# Patient Record
Sex: Female | Born: 1990 | Hispanic: Yes | Marital: Single | Smoking: Never smoker
Health system: Southern US, Community
[De-identification: ages and names within clinical notes are randomized; demographics above are authoritative.]

---

## 2009-12-28 ENCOUNTER — Ambulatory Visit: Payer: Self-pay | Admitting: Family Medicine

## 2010-06-13 ENCOUNTER — Observation Stay: Payer: Self-pay | Admitting: Obstetrics and Gynecology

## 2010-06-14 ENCOUNTER — Inpatient Hospital Stay: Payer: Self-pay | Admitting: Obstetrics and Gynecology

## 2013-06-11 ENCOUNTER — Inpatient Hospital Stay: Payer: Self-pay

## 2013-06-11 LAB — CBC WITH DIFFERENTIAL/PLATELET
Basophil #: 0.1 10*3/uL (ref 0.0–0.1)
Eosinophil %: 0.3 %
HGB: 12.8 g/dL (ref 12.0–16.0)
Lymphocyte #: 1.9 10*3/uL (ref 1.0–3.6)
Lymphocyte %: 14.6 %
Monocyte #: 1 x10 3/mm — ABNORMAL HIGH (ref 0.2–0.9)
Monocyte %: 7.7 %
Neutrophil #: 9.7 10*3/uL — ABNORMAL HIGH (ref 1.4–6.5)
Neutrophil %: 76.6 %
RBC: 4.17 10*6/uL (ref 3.80–5.20)

## 2013-06-13 LAB — HEMATOCRIT: HCT: 32.3 % — ABNORMAL LOW (ref 35.0–47.0)

## 2015-03-15 NOTE — H&P (Signed)
L&D Evaluation:  History:  HPI 24yo G2P1001 with LMP of 09/15/12 & [facility] significant for migraines, Tdap and abnormal pap. SROM at 2200 clear fluid. UC's are becoming more active. Pt wants epidural when able.   Presents with leaking fluid   Patient's Medical History No Chronic Illness   Patient's Surgical History none   Medications Pre Natal Vitamins   Allergies NKDA   Social History EtOH   Family History Non-Contributory   ROS:  ROS All systems were reviewed.  HEENT, CNS, GI, GU, Respiratory, CV, Renal and Musculoskeletal systems were found to be normal.   Exam:  Vital Signs stable  BP 114/67   General no apparent distress   Mental Status clear   Chest clear   Heart normal sinus rhythm, no murmur/gallop/rubs   Abdomen gravid, non-tender   Estimated Fetal Weight Average for gestational age   Fetal Position vtx   Back no CVAT   Mebranes Ruptured, clear   FHT normal rate with no decels   Ucx regular   Skin dry   Lymph no lymphadenopathy   Impression:  Impression early labor, iUP AT TERM   Plan:  Plan monitor contractions and for cervical change   Electronic Signatures: Sharee PimpleJones, Caron W (CNM)  (Signed 07-Aug-14 23:41)  Authored: L&D Evaluation   Last Updated: 07-Aug-14 23:41 by Sharee PimpleJones, Caron W (CNM)

## 2015-11-06 NOTE — L&D Delivery Note (Addendum)
Delivery Note At 2:02 AM a viable female was delivered via Vaginal, Spontaneous Delivery  At 0202 on 08/04/16(Presentation: ;  ).  APGAR:8/8 , ; weight  .  Spontaneous cry  Placenta status:intact , stained  , .  Corddelayed cord clamping :  with the following complications: .None   Anesthesia:  none Episiotomy:  none Lacerations: none  Est. Blood Loss (mL)300cc   Mom to postpartum.  Baby to Couplet care / Skin to Skin.  SCHERMERHORN,THOMAS 08/04/2016, 2:09 AM

## 2016-01-18 LAB — OB RESULTS CONSOLE GC/CHLAMYDIA
CHLAMYDIA, DNA PROBE: NEGATIVE
GC PROBE AMP, GENITAL: NEGATIVE

## 2016-01-18 LAB — OB RESULTS CONSOLE RPR: RPR: NONREACTIVE

## 2016-01-18 LAB — OB RESULTS CONSOLE HEPATITIS B SURFACE ANTIGEN: Hepatitis B Surface Ag: NEGATIVE

## 2016-01-18 LAB — OB RESULTS CONSOLE RUBELLA ANTIBODY, IGM: Rubella: IMMUNE

## 2016-01-18 LAB — OB RESULTS CONSOLE ABO/RH: RH Type: POSITIVE

## 2016-01-18 LAB — OB RESULTS CONSOLE HIV ANTIBODY (ROUTINE TESTING): HIV: NONREACTIVE

## 2016-01-18 LAB — OB RESULTS CONSOLE VARICELLA ZOSTER ANTIBODY, IGG: VARICELLA IGG: IMMUNE

## 2016-02-14 ENCOUNTER — Other Ambulatory Visit: Payer: Self-pay | Admitting: Primary Care

## 2016-02-14 DIAGNOSIS — Z3482 Encounter for supervision of other normal pregnancy, second trimester: Secondary | ICD-10-CM

## 2016-02-28 ENCOUNTER — Ambulatory Visit
Admission: RE | Admit: 2016-02-28 | Discharge: 2016-02-28 | Disposition: A | Discharge status: Home / Self Care | Payer: Medicaid Other | Source: Ambulatory Visit | Attending: Primary Care | Admitting: Primary Care

## 2016-02-28 DIAGNOSIS — Z3482 Encounter for supervision of other normal pregnancy, second trimester: Secondary | ICD-10-CM | POA: Diagnosis not present

## 2016-02-28 DIAGNOSIS — Z3A17 17 weeks gestation of pregnancy: Secondary | ICD-10-CM | POA: Insufficient documentation

## 2016-03-15 ENCOUNTER — Other Ambulatory Visit: Payer: Self-pay | Admitting: Primary Care

## 2016-03-15 DIAGNOSIS — Z3482 Encounter for supervision of other normal pregnancy, second trimester: Secondary | ICD-10-CM

## 2016-03-23 ENCOUNTER — Ambulatory Visit: Payer: Self-pay

## 2016-04-05 ENCOUNTER — Ambulatory Visit
Admission: RE | Admit: 2016-04-05 | Discharge: 2016-04-05 | Disposition: A | Discharge status: Home / Self Care | Payer: Medicaid Other | Source: Ambulatory Visit | Attending: Primary Care | Admitting: Primary Care

## 2016-04-05 DIAGNOSIS — Z3A22 22 weeks gestation of pregnancy: Secondary | ICD-10-CM | POA: Diagnosis not present

## 2016-04-05 DIAGNOSIS — Z3482 Encounter for supervision of other normal pregnancy, second trimester: Secondary | ICD-10-CM | POA: Insufficient documentation

## 2016-07-07 LAB — OB RESULTS CONSOLE GBS: GBS: NEGATIVE

## 2016-08-04 ENCOUNTER — Encounter: Payer: Self-pay | Admitting: *Deleted

## 2016-08-04 ENCOUNTER — Inpatient Hospital Stay
Admission: EM | Admit: 2016-08-04 | Discharge: 2016-08-05 | DRG: 775 | Disposition: A | Discharge status: Home / Self Care | Payer: Medicaid Other | Attending: Obstetrics and Gynecology | Admitting: Obstetrics and Gynecology

## 2016-08-04 DIAGNOSIS — Z3A39 39 weeks gestation of pregnancy: Secondary | ICD-10-CM | POA: Diagnosis not present

## 2016-08-04 DIAGNOSIS — Z3483 Encounter for supervision of other normal pregnancy, third trimester: Secondary | ICD-10-CM | POA: Diagnosis present

## 2016-08-04 DIAGNOSIS — IMO0001 Reserved for inherently not codable concepts without codable children: Secondary | ICD-10-CM

## 2016-08-04 LAB — TYPE AND SCREEN
ABO/RH(D): A POS
ANTIBODY SCREEN: NEGATIVE

## 2016-08-04 LAB — CBC
HEMATOCRIT: 37.1 % (ref 35.0–47.0)
HEMATOCRIT: 34.4 % — AB (ref 35.0–47.0)
HEMOGLOBIN: 12.9 g/dL (ref 12.0–16.0)
Hemoglobin: 12 g/dL (ref 12.0–16.0)
MCH: 31.1 pg (ref 26.0–34.0)
MCH: 31.6 pg (ref 26.0–34.0)
MCHC: 34.9 g/dL (ref 32.0–36.0)
MCHC: 35 g/dL (ref 32.0–36.0)
MCV: 89.2 fL (ref 80.0–100.0)
MCV: 90.2 fL (ref 80.0–100.0)
Platelets: 196 10*3/uL (ref 150–440)
Platelets: 193 10*3/uL (ref 150–440)
RBC: 4.15 MIL/uL (ref 3.80–5.20)
RBC: 3.81 MIL/uL (ref 3.80–5.20)
RDW: 12.9 % (ref 11.5–14.5)
RDW: 13.1 % (ref 11.5–14.5)
WBC: 15.6 10*3/uL — AB (ref 3.6–11.0)
WBC: 20 10*3/uL — AB (ref 3.6–11.0)

## 2016-08-04 MED ORDER — DIPHENHYDRAMINE HCL 25 MG PO CAPS: 25.0000 mg | ORAL_CAPSULE | Freq: Four times a day (QID) | ORAL | Status: DC | PRN

## 2016-08-04 MED ORDER — LIDOCAINE HCL (PF) 1 % IJ SOLN: 30.0000 mL | INTRAMUSCULAR | Status: DC | PRN

## 2016-08-04 MED ORDER — ONDANSETRON HCL 4 MG/2ML IJ SOLN
4.0000 mg | Freq: Four times a day (QID) | INTRAMUSCULAR | Status: DC | PRN
Start: 2016-08-04 — End: 2016-08-04

## 2016-08-04 MED ORDER — MISOPROSTOL 200 MCG PO TABS
ORAL_TABLET | ORAL | Status: AC
  Filled 2016-08-04: qty 4

## 2016-08-04 MED ORDER — ACETAMINOPHEN 325 MG PO TABS: 650.0000 mg | ORAL_TABLET | ORAL | Status: DC | PRN

## 2016-08-04 MED ORDER — BENZOCAINE-MENTHOL 20-0.5 % EX AERO
1.0000 "application " | INHALATION_SPRAY | CUTANEOUS | Status: DC | PRN
  Administered 2016-08-04: 1 via TOPICAL
  Filled 2016-08-04: qty 56

## 2016-08-04 MED ORDER — OXYCODONE-ACETAMINOPHEN 5-325 MG PO TABS: 1.0000 | ORAL_TABLET | ORAL | Status: DC | PRN

## 2016-08-04 MED ORDER — LACTATED RINGERS IV SOLN: 500.0000 mL | INTRAVENOUS | Status: DC | PRN

## 2016-08-04 MED ORDER — MEASLES, MUMPS & RUBELLA VAC ~~LOC~~ INJ
0.5000 mL | INJECTION | Freq: Once | SUBCUTANEOUS | Status: AC
  Administered 2016-08-05: 0.5 mL via SUBCUTANEOUS
  Filled 2016-08-04: qty 0.5

## 2016-08-04 MED ORDER — OXYCODONE-ACETAMINOPHEN 5-325 MG PO TABS: 2.0000 | ORAL_TABLET | ORAL | Status: DC | PRN

## 2016-08-04 MED ORDER — ONDANSETRON HCL 4 MG PO TABS: 4.0000 mg | ORAL_TABLET | ORAL | Status: DC | PRN

## 2016-08-04 MED ORDER — MAGNESIUM HYDROXIDE 400 MG/5ML PO SUSP: 30.0000 mL | ORAL | Status: DC | PRN

## 2016-08-04 MED ORDER — PRENATAL MULTIVITAMIN CH
1.0000 | ORAL_TABLET | Freq: Every day | ORAL | Status: DC
  Administered 2016-08-04 – 2016-08-05 (×2): 1 via ORAL
  Filled 2016-08-04 (×2): qty 1

## 2016-08-04 MED ORDER — SOD CITRATE-CITRIC ACID 500-334 MG/5ML PO SOLN
30.0000 mL | ORAL | Status: DC | PRN
  Filled 2016-08-04: qty 30

## 2016-08-04 MED ORDER — SENNOSIDES-DOCUSATE SODIUM 8.6-50 MG PO TABS
2.0000 | ORAL_TABLET | ORAL | Status: DC
  Administered 2016-08-05: 2 via ORAL
  Filled 2016-08-04: qty 2

## 2016-08-04 MED ORDER — OXYTOCIN 40 UNITS IN LACTATED RINGERS INFUSION - SIMPLE MED
INTRAVENOUS | Status: AC
  Administered 2016-08-04: 500 mL via INTRAVENOUS
  Filled 2016-08-04: qty 1000

## 2016-08-04 MED ORDER — DIBUCAINE 1 % RE OINT: 1.0000 "application " | TOPICAL_OINTMENT | RECTAL | Status: DC | PRN

## 2016-08-04 MED ORDER — COCONUT OIL OIL
1.0000 "application " | TOPICAL_OIL | Status: DC | PRN
  Administered 2016-08-04: 1 via TOPICAL
  Filled 2016-08-04: qty 120

## 2016-08-04 MED ORDER — ZOLPIDEM TARTRATE 5 MG PO TABS: 5.0000 mg | ORAL_TABLET | Freq: Every evening | ORAL | Status: DC | PRN

## 2016-08-04 MED ORDER — ONDANSETRON HCL 4 MG/2ML IJ SOLN: 4.0000 mg | INTRAMUSCULAR | Status: DC | PRN

## 2016-08-04 MED ORDER — OXYTOCIN 10 UNIT/ML IJ SOLN
INTRAMUSCULAR | Status: AC
  Filled 2016-08-04: qty 2

## 2016-08-04 MED ORDER — SIMETHICONE 80 MG PO CHEW: 80.0000 mg | CHEWABLE_TABLET | ORAL | Status: DC | PRN

## 2016-08-04 MED ORDER — BUTORPHANOL TARTRATE 1 MG/ML IJ SOLN: 1.0000 mg | INTRAMUSCULAR | Status: DC | PRN

## 2016-08-04 MED ORDER — LIDOCAINE HCL (PF) 1 % IJ SOLN
INTRAMUSCULAR | Status: AC
  Filled 2016-08-04: qty 30

## 2016-08-04 MED ORDER — OXYTOCIN 40 UNITS IN LACTATED RINGERS INFUSION - SIMPLE MED: 2.5000 [IU]/h | INTRAVENOUS | Status: DC

## 2016-08-04 MED ORDER — WITCH HAZEL-GLYCERIN EX PADS: 1.0000 "application " | MEDICATED_PAD | CUTANEOUS | Status: DC | PRN

## 2016-08-04 MED ORDER — AMMONIA AROMATIC IN INHA
RESPIRATORY_TRACT | Status: AC
  Filled 2016-08-04: qty 10

## 2016-08-04 MED ORDER — LACTATED RINGERS IV SOLN
INTRAVENOUS | Status: DC
  Administered 2016-08-04: 01:00:00 via INTRAVENOUS

## 2016-08-04 MED ORDER — OXYTOCIN BOLUS FROM INFUSION
500.0000 mL | Freq: Once | INTRAVENOUS | Status: AC
  Administered 2016-08-04: 500 mL via INTRAVENOUS

## 2016-08-04 MED ORDER — IBUPROFEN 600 MG PO TABS
600.0000 mg | ORAL_TABLET | Freq: Four times a day (QID) | ORAL | Status: DC
  Administered 2016-08-04 – 2016-08-05 (×6): 600 mg via ORAL
  Filled 2016-08-04 (×6): qty 1

## 2016-08-04 MED ORDER — ACETAMINOPHEN 325 MG PO TABS
650.0000 mg | ORAL_TABLET | ORAL | Status: DC | PRN
  Administered 2016-08-05: 650 mg via ORAL
  Filled 2016-08-04: qty 2

## 2016-08-04 MED ORDER — FERROUS SULFATE 325 (65 FE) MG PO TABS
325.0000 mg | ORAL_TABLET | Freq: Two times a day (BID) | ORAL | Status: DC
  Administered 2016-08-04 – 2016-08-05 (×3): 325 mg via ORAL
  Filled 2016-08-04 (×3): qty 1

## 2016-08-04 NOTE — H&P (Signed)
Hannah Carroll is a 25 y.o. female presenting for labor. OB History    Gravida Para Term Preterm AB Living   3 2 2    0 2   SAB TAB Ectopic Multiple Live Births                 History reviewed. No pertinent past medical history. History reviewed. No pertinent surgical history. Family History: family history is not on file. Social History:  reports that she has never smoked. She has never used smokeless tobacco. She reports that she does not drink alcohol or use drugs.     Maternal Diabetes: No Genetic Screening: Normal Maternal Ultrasounds/Referrals: Normal Fetal Ultrasounds or other Referrals:  None Maternal Substance Abuse:  No Significant Maternal Medications:  None Significant Maternal Lab Results:  None Other Comments:  None  ROS History Dilation: 8 Exam by:: SJD, MD Blood pressure 105/76, pulse (!) 116, temperature 98.4 F (36.9 C), temperature source Oral, resp. rate 20, height 5\' 1"  (1.549 m), weight 187 lb (84.8 kg). Exam Physical Exam  Lungs CTA  cv RRR cx exam as above . + meconium on arom   Prenatal labs: nst efm 130 + accels , no decels  ABO, Rh:   a+ Antibody:  neg Rubella:  imm RPR:   NR HBsAg:   neg HIV:   neg GBS:   neg  Assessment/Plan: Active labor  +meconium  Anticipate svd shortly    Hannah Carroll 08/04/2016, 12:55 AM

## 2016-08-05 LAB — RPR: RPR Ser Ql: NONREACTIVE

## 2016-08-05 MED ORDER — IBUPROFEN 600 MG PO TABS: 600.0000 mg | ORAL_TABLET | Freq: Four times a day (QID) | ORAL | 0 refills | Status: AC

## 2016-08-05 NOTE — Discharge Instructions (Signed)
Call your doctor for increased pain or vaginal bleeding, temperature above 100.4, depression, or concerns.  No strenuous activity or heavy lifting for 6 weeks.  No intercourse, tampons, douching, or enemas for 6 weeks.  No tub baths-showers only.  No driving for 2 weeks or while taking pain medications.  Continue prenatal vitamin and iron.  Increase calories and fluids while breastfeeding. °

## 2016-08-05 NOTE — Discharge Summary (Signed)
Obstetric Discharge Summary Reason for Admission: onset of labor Prenatal Procedures: none Intrapartum Procedures: spontaneous vaginal delivery Postpartum Procedures: none Complications-Operative and Postpartum: none Hemoglobin  Date Value Ref Range Status  08/04/2016 12.0 12.0 - 16.0 g/dL Final   HGB  Date Value Ref Range Status  06/11/2013 12.8 12.0 - 16.0 g/dL Final   HCT  Date Value Ref Range Status  08/04/2016 34.4 (L) 35.0 - 47.0 % Final  06/13/2013 32.3 (L) 35.0 - 47.0 % Final    Physical Exam:  General: alert and cooperative Lochia: appropriate Uterine Fundus: firm Incision: n/a DVT Evaluation: No evidence of DVT seen on physical exam.  Discharge Diagnoses: Term Pregnancy-delivered  Discharge Information: Date: 08/05/2016 Activity: pelvic rest Diet: routine Medications: Ibuprofen Condition: stable Instructions: refer to practice specific booklet Discharge to: home Follow-up Information    [facility]   Specialty:  General Practice Why:  pp care Contact information: [address] [place] [address] [phone]           Newborn Data: Live born female  Birth Weight: 7 lb 9.7 oz (3450 g) APGAR: 8, 8  Home with mother.  SCHERMERHORN,THOMAS 08/05/2016, 10:09 AM

## 2016-08-05 NOTE — Progress Notes (Signed)
Discharge instructions provided.  Pt and sig other verbalize understanding of all instructions and follow-up care.  Prescription given.  Pt discharged to home with infant at 1500 on 08/05/16 via wheelchair by CNA. Reynold BowenSusan Paisley Jere Bostrom, RN 08/05/2016 3:19 PM

## 2016-08-05 NOTE — Progress Notes (Signed)
Prenatal records indicate that pt received TDaP vaccine on 06/07/16 and Influenza vaccine on 07/26/16. Reynold BowenSusan Paisley Kimiko Common, RN 08/05/2016 12:44 PM

## 2017-09-17 IMAGING — US US OB COMP +14 WK
1 series · 13 of 28 positions shown · non-contrast
Comparison: none

CLINICAL DATA: Pregnancy.

EXAM:
OBSTETRICAL ULTRASOUND >14 WKS

[Series 1: us ob comp +14 wk · 0.25mm/px · 13 of 71 slices shown]
[im 3/71]
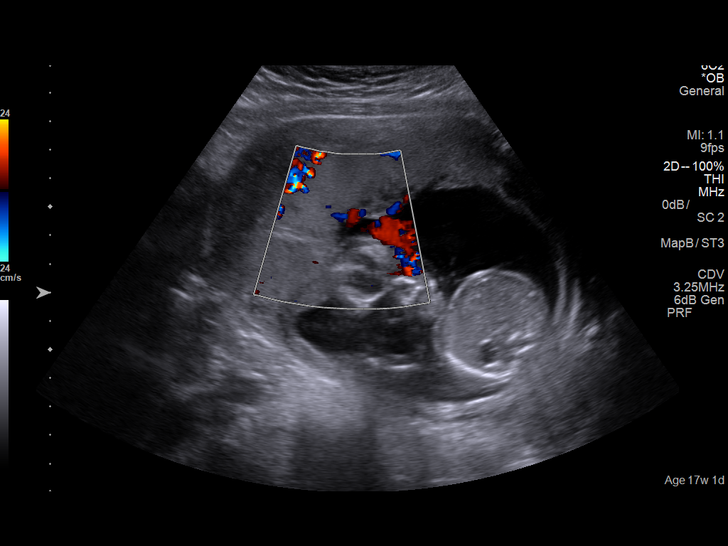
[im 8/71]
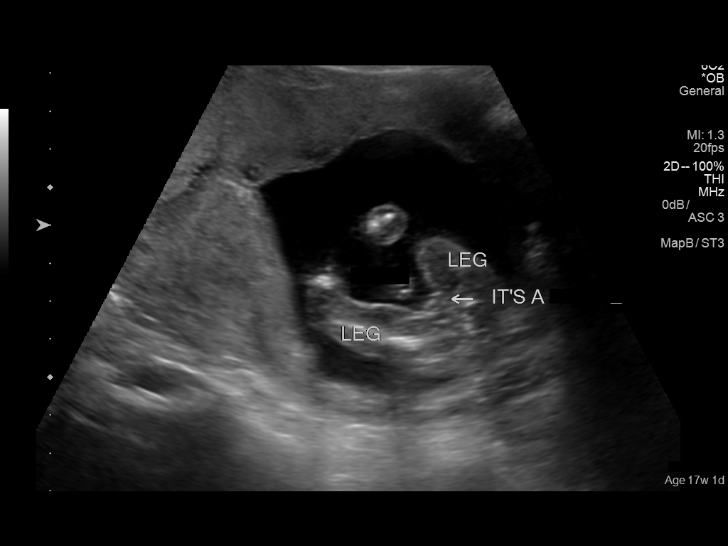
[im 13/71]
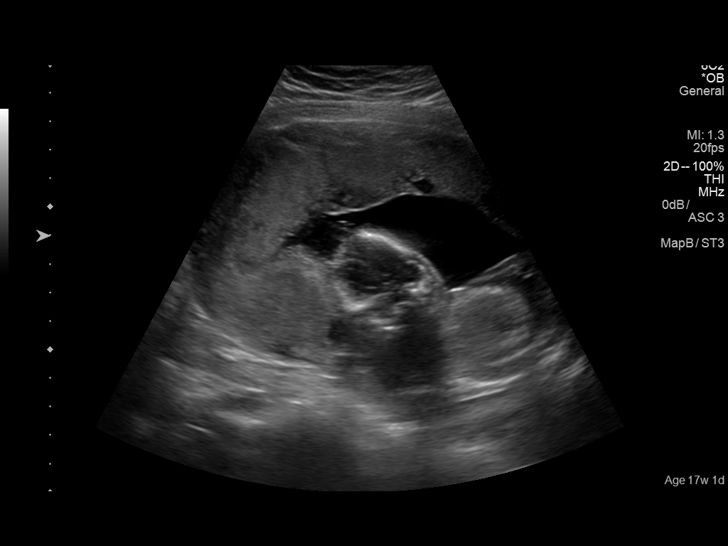
[im 19/71]
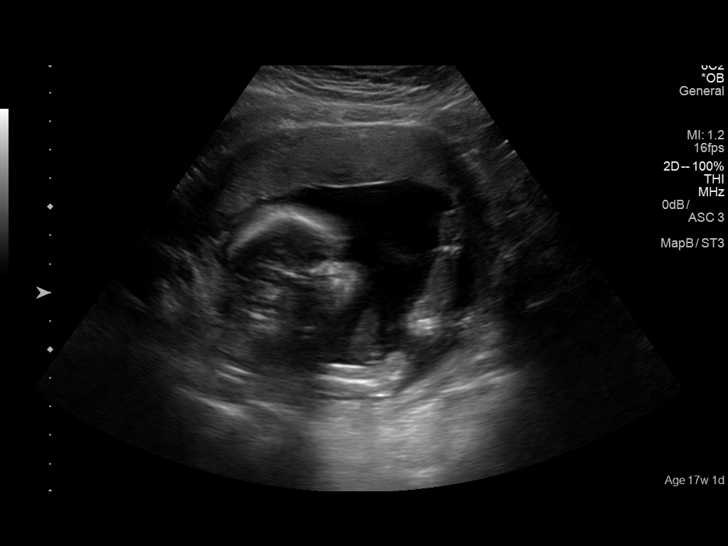
[im 24/71]
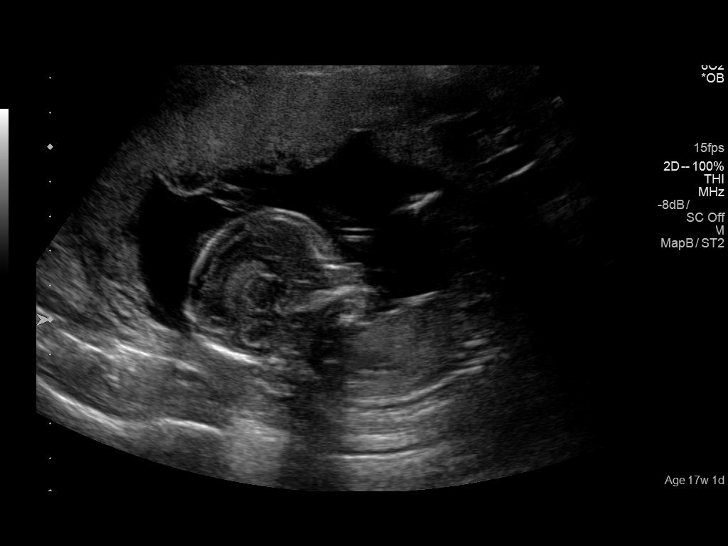
[im 29/71]
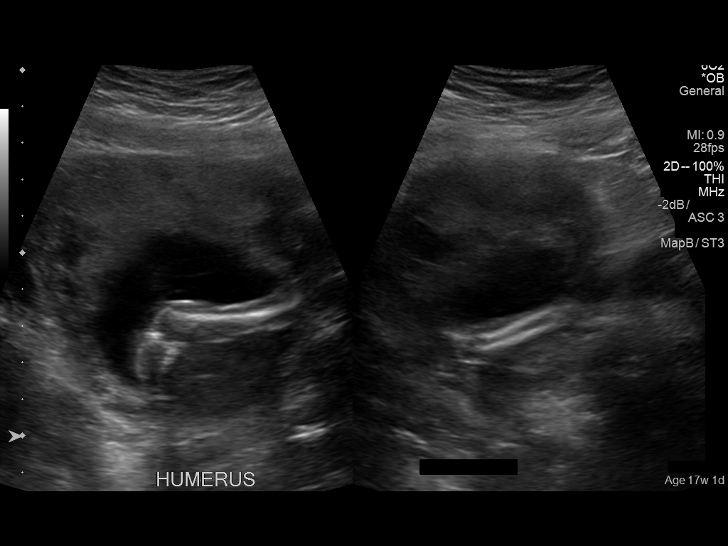
[im 37/71]
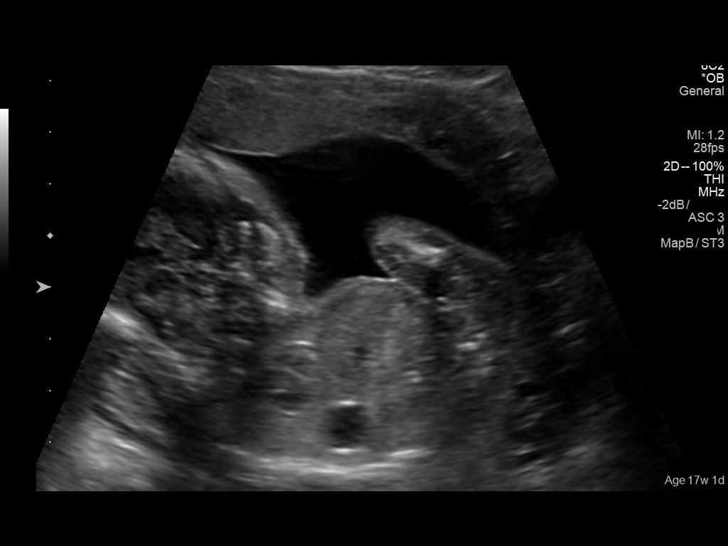
[im 42/71]
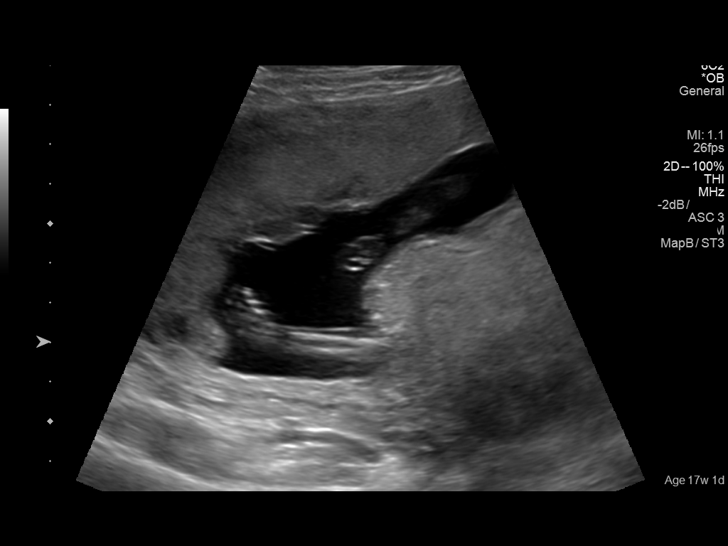
[im 47/71]
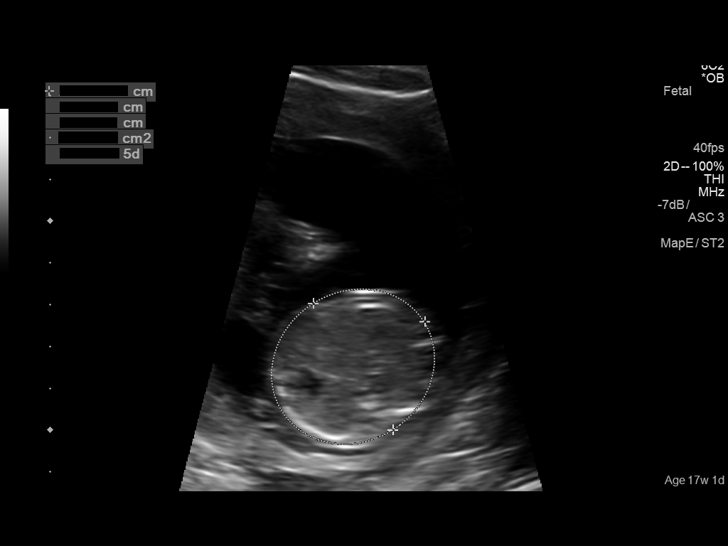
[im 52/71]
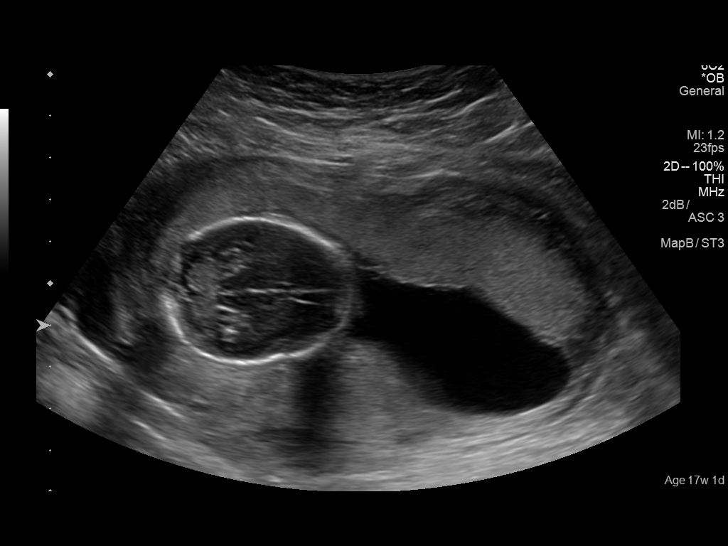
[im 58/71]
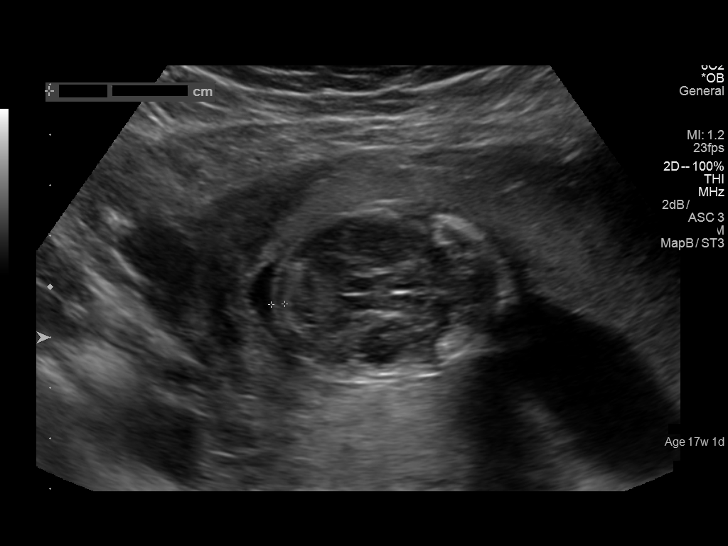
[im 63/71]
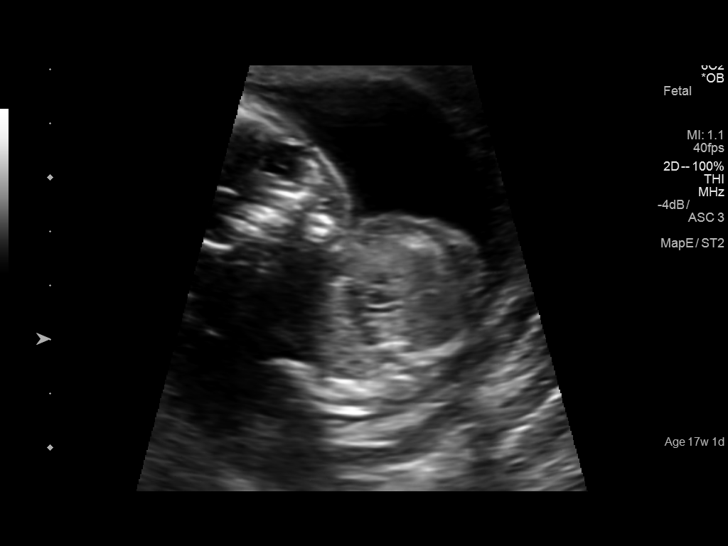
[im 68/71]
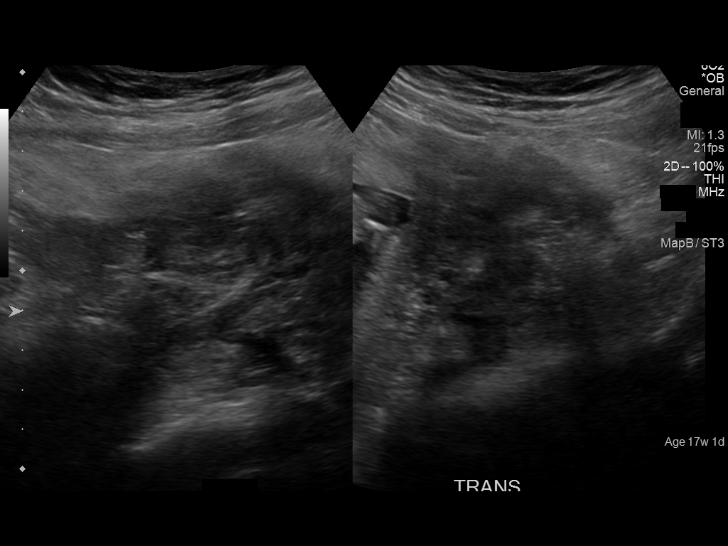

[13 of 28 positions shown; findings below may reference images not displayed]

FINDINGS: Number of Fetuses: 1

Heart Rate:  149 bpm

Movement: Present

Presentation: Transverse

Previa: None

Placental Location: Fundal

Amniotic Fluid (Subjective): Normal

Amniotic Fluid (Objective):

Vertical pocket 3.1cm

FETAL BIOMETRY

BPD:  13.4cm 16w 3d

HC:    13.5cm  17w   0d

AC:   12.0cm  17w   5d

FL:   2.4cm  17w   1d

Current Mean GA: 17w 1d              US EDC: 08/06/2016

FETAL ANATOMY

Lateral Ventricles: Visualized

Thalami/CSP: Visualized

Posterior Fossa:  Visualized

Nuchal Region: Visualized

Upper Lip: Visualized

Spine: Inadequately visualized due to fetal position

4 Chamber Heart on Left: Visualized

LVOT: Visualized

RVOT: Visualized

Stomach on Left: Visualized

3 Vessel Cord: Visualized

Cord Insertion site: Visualized

Kidneys: Inadequately visualized and fetal position

Bladder: Visualized

Extremities: Visualized

Technically difficult due to: Fetal position.

Maternal Findings:

Cervix:  Unremarkable.  Cervix is closed.
IMPRESSION: Single viable intrauterine pregnancy at 17 weeks 1 day. Fetal spine
and kidneys rapidly visualized due to fetal position. Follow-up exam
can be obtained.

## 2017-10-24 IMAGING — US US OB FOLLOW-UP
1 series · 14 of 28 positions shown · non-contrast
Comparison: none

CLINICAL DATA: Pregnancy.

EXAM:
OBSTETRICAL ULTRASOUND >14 WKS AND TRANSVAGINAL OB US

[Series 1: us ob follow-up · 0.23mm/px · 14 of 52 slices shown]
[im 2/52]
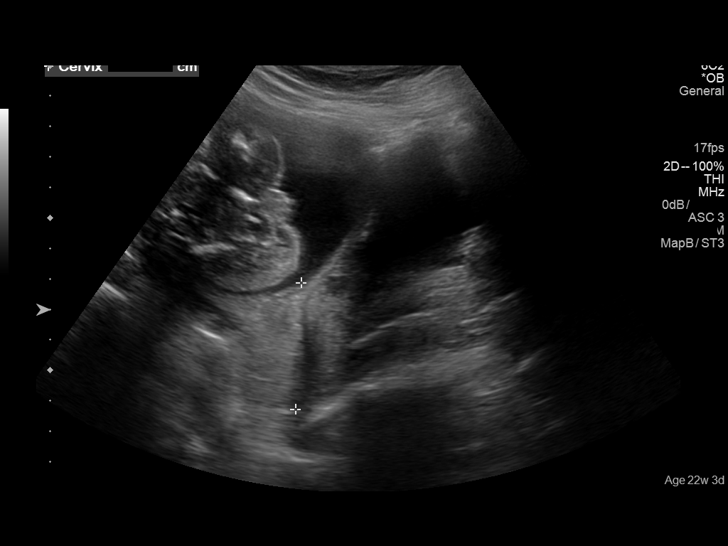
[im 6/52]
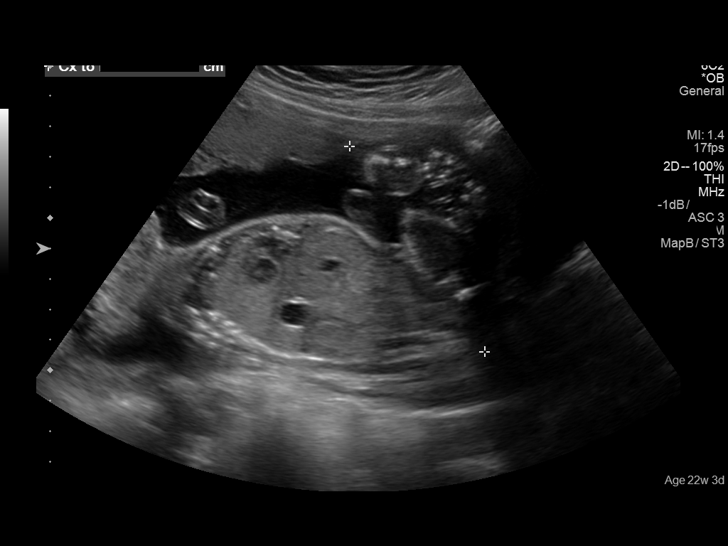
[im 10/52]
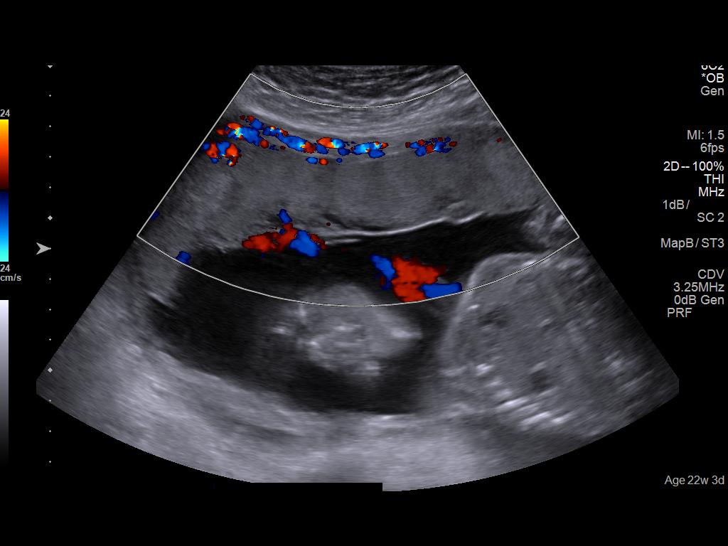
[im 14/52]
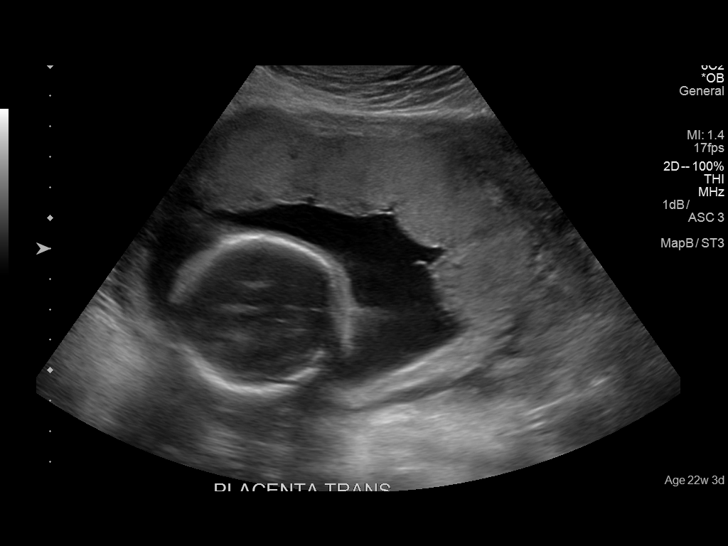
[im 18/52]
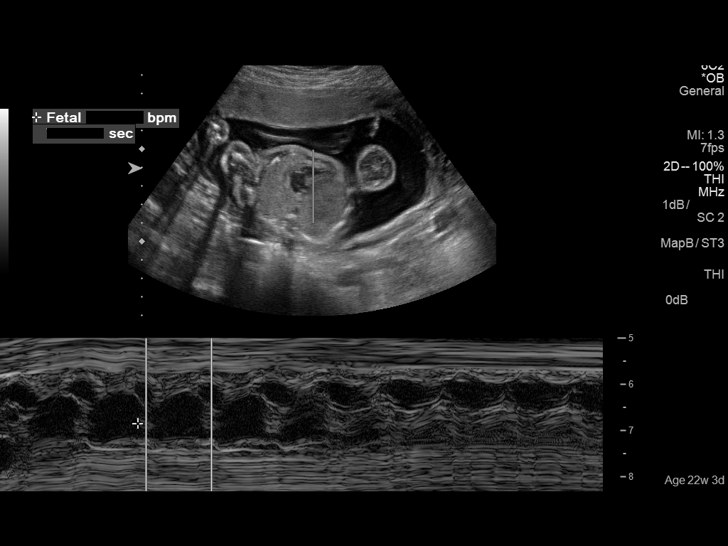
[im 21/52]
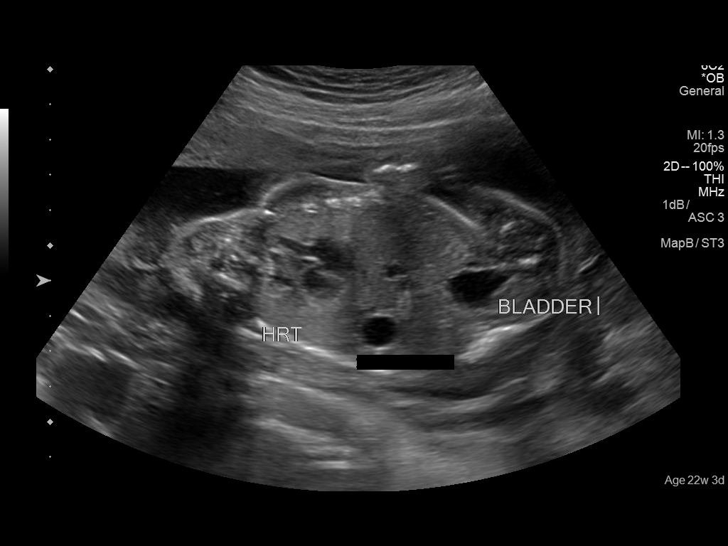
[im 25/52]
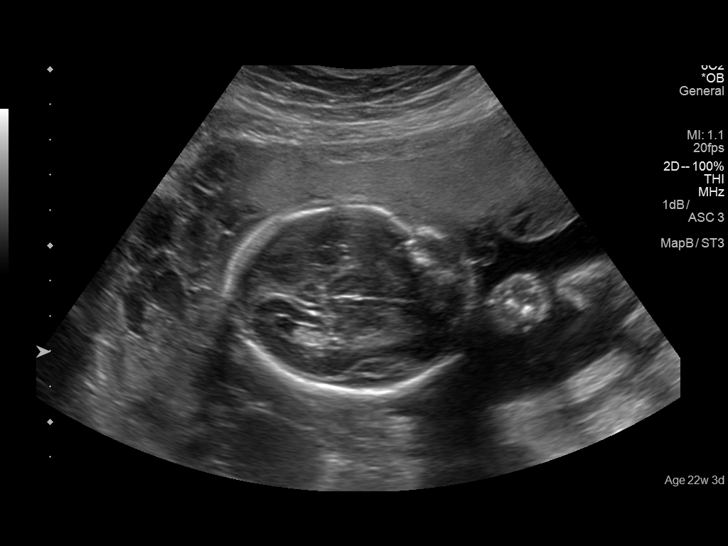
[im 29/52]
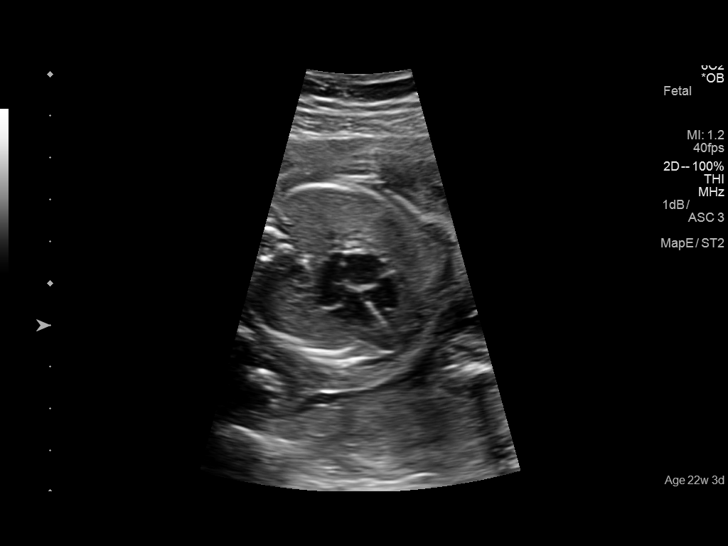
[im 33/52]
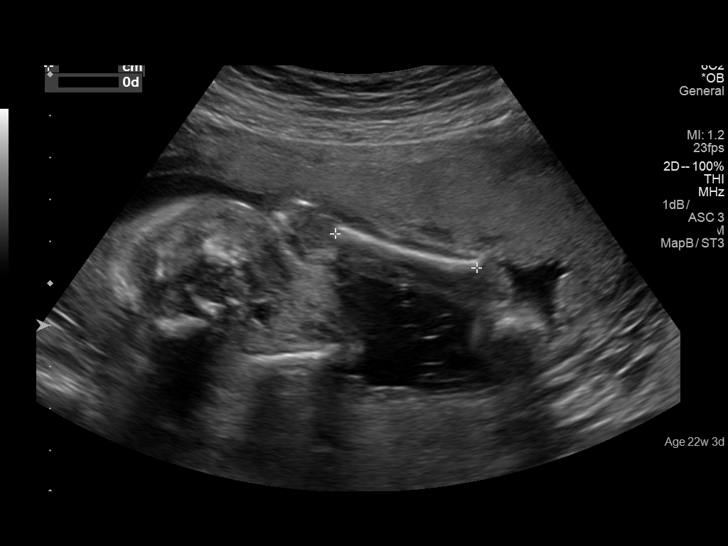
[im 36/52]
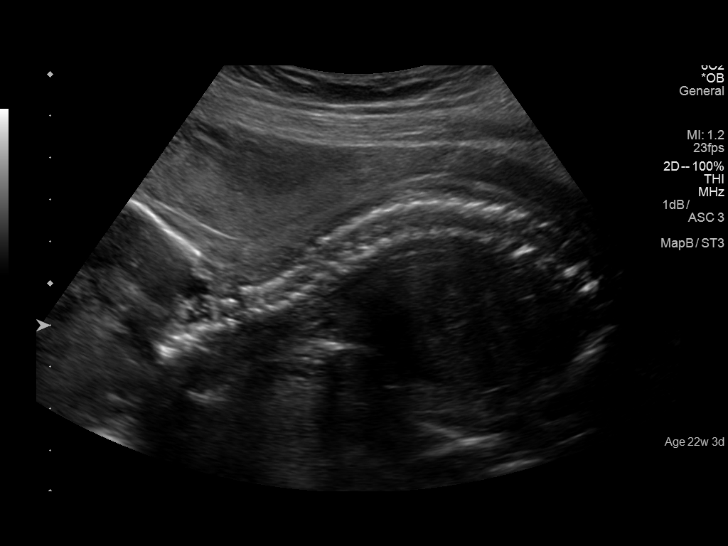
[im 40/52]
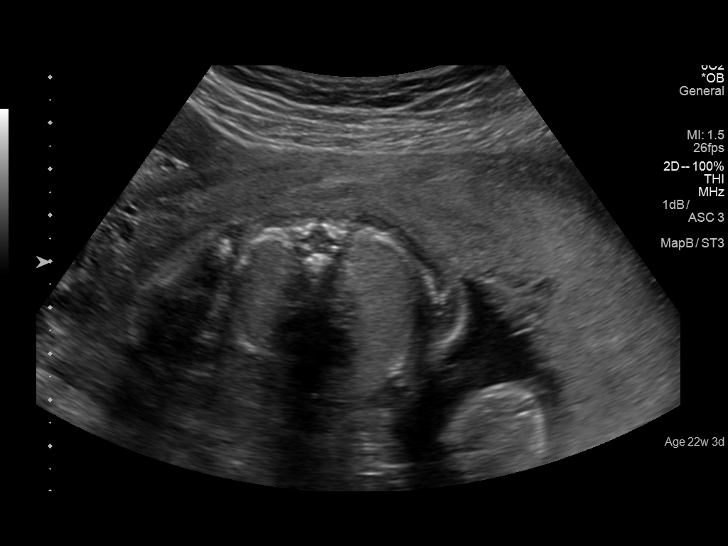
[im 44/52]
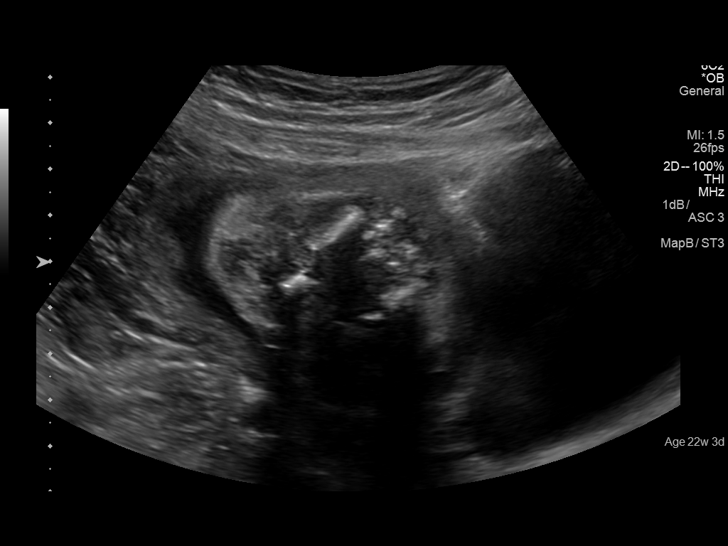
[im 48/52]
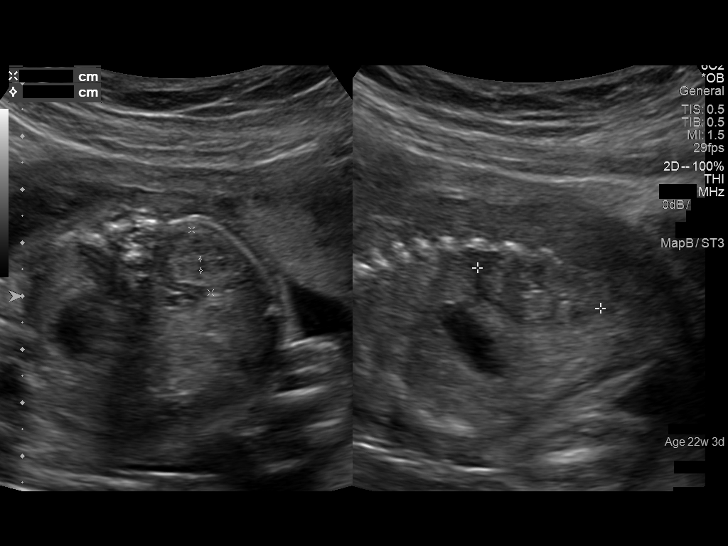
[im 52/52]
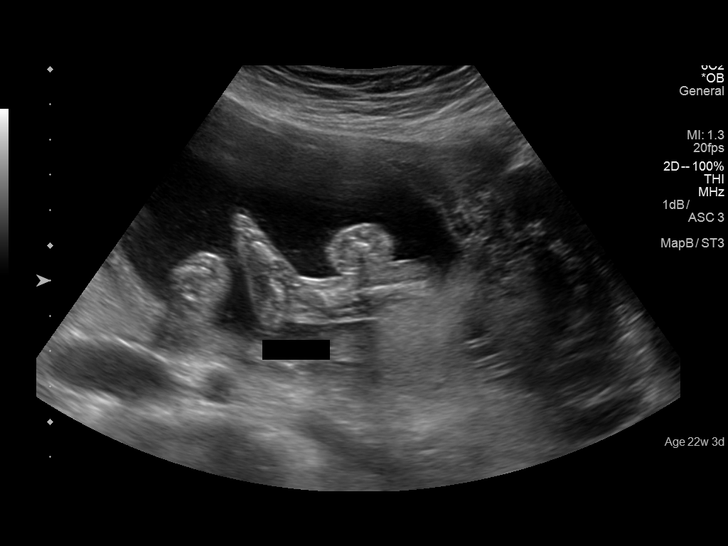

[14 of 28 positions shown; findings below may reference images not displayed]

FINDINGS: Number of Fetuses: 1

Heart Rate:  157 bpm

Movement: Present

Presentation: Breech

Previa: No

Placental Location: Fundal

Amniotic Fluid (Subjective): Normal

Amniotic Fluid (Objective):

Vertical pocket 6.7cm

FETAL BIOMETRY

BPD:  5.3cm 22w 0d

HC:    20.1cm  22w   2d

AC:   18.5cm  23w   2d

FL:   3.8cm  22w   2d

Current Mean GA: 22w 3d              US EDC: 08/06/2016

FETAL ANATOMY

Lateral Ventricles: Visualized

Thalami/CSP: Previously visualized

Posterior Fossa:  Previously visualized

Nuchal Region: Previously visualized

Upper Lip: Visualized

Spine: Visualized

4 Chamber Heart on Left: Previously visualized

LVOT: Previously visualized

RVOT: Previously visualized

Stomach on Left: Visualized

3 Vessel Cord: Previously visualized

Cord Insertion site: Previously visualized

Kidneys: Visualized

Bladder: Visualized

Extremities: Previously visualized

Maternal Findings:

Cervix:  3.9 cm and closed.
IMPRESSION: Single viable intrauterine pregnancy in breech presentation at 22
weeks 3 days.

## 2020-01-22 ENCOUNTER — Ambulatory Visit: Payer: Medicaid Other | Attending: Internal Medicine

## 2020-01-22 DIAGNOSIS — Z23 Encounter for immunization: Secondary | ICD-10-CM

## 2020-01-22 NOTE — Progress Notes (Signed)
   Covid-19 Vaccination Clinic  Name:  Hannah Carroll    MRN: [id number] DOB: [birth year]  01/22/2020  Ms. Hannah Carroll was observed post Covid-19 immunization for 15 minutes without incident. She was provided with Vaccine Information Sheet and instruction to access the V-Safe system.   Ms. Hannah Carroll was instructed to call 911 with any severe reactions post vaccine: Marland Kitchen Difficulty breathing  . Swelling of face and throat  . A fast heartbeat  . A bad rash all over body  . Dizziness and weakness   Immunizations Administered    Name Date Dose VIS Date Route   Pfizer COVID-19 Vaccine 01/22/2020 12:59 PM 0.3 mL 10/16/2019 Intramuscular   Manufacturer: [employer], [employer]   Lot: [id number]   NDC: [address]-3

## 2020-02-12 ENCOUNTER — Ambulatory Visit: Payer: Medicaid Other | Attending: Internal Medicine

## 2020-02-12 DIAGNOSIS — Z23 Encounter for immunization: Secondary | ICD-10-CM

## 2020-02-12 NOTE — Progress Notes (Signed)
   Covid-19 Vaccination Clinic  Name:  Hannah Carroll    MRN: [id number] DOB: [birth year]  02/12/2020  Ms. Hannah Carroll was observed post Covid-19 immunization for 15 minutes without incident. She was provided with Vaccine Information Sheet and instruction to access the V-Safe system.   Ms. Hannah Carroll was instructed to call 911 with any severe reactions post vaccine: Marland Kitchen Difficulty breathing  . Swelling of face and throat  . A fast heartbeat  . A bad rash all over body  . Dizziness and weakness   Immunizations Administered    Name Date Dose VIS Date Route   Pfizer COVID-19 Vaccine 02/12/2020  1:01 PM 0.3 mL 10/16/2019 Intramuscular   Manufacturer: [employer], [employer]   Lot: [id number]   NDC: [address]-3

## 2020-05-05 DIAGNOSIS — Z419 Encounter for procedure for purposes other than remedying health state, unspecified: Secondary | ICD-10-CM | POA: Diagnosis not present

## 2020-06-05 DIAGNOSIS — Z419 Encounter for procedure for purposes other than remedying health state, unspecified: Secondary | ICD-10-CM | POA: Diagnosis not present

## 2020-07-06 DIAGNOSIS — Z419 Encounter for procedure for purposes other than remedying health state, unspecified: Secondary | ICD-10-CM | POA: Diagnosis not present

## 2020-08-05 DIAGNOSIS — Z419 Encounter for procedure for purposes other than remedying health state, unspecified: Secondary | ICD-10-CM | POA: Diagnosis not present

## 2020-09-05 DIAGNOSIS — Z419 Encounter for procedure for purposes other than remedying health state, unspecified: Secondary | ICD-10-CM | POA: Diagnosis not present

## 2020-10-05 DIAGNOSIS — Z419 Encounter for procedure for purposes other than remedying health state, unspecified: Secondary | ICD-10-CM | POA: Diagnosis not present

## 2020-10-15 ENCOUNTER — Other Ambulatory Visit: Payer: Self-pay

## 2020-10-15 ENCOUNTER — Emergency Department
Admission: EM | Admit: 2020-10-15 | Discharge: 2020-10-15 | Disposition: A | Discharge status: Home / Self Care | Payer: No Typology Code available for payment source | Attending: Emergency Medicine | Admitting: Emergency Medicine

## 2020-10-15 DIAGNOSIS — K279 Peptic ulcer, site unspecified, unspecified as acute or chronic, without hemorrhage or perforation: Secondary | ICD-10-CM | POA: Insufficient documentation

## 2020-10-15 DIAGNOSIS — B9681 Helicobacter pylori [H. pylori] as the cause of diseases classified elsewhere: Secondary | ICD-10-CM | POA: Insufficient documentation

## 2020-10-15 DIAGNOSIS — R1013 Epigastric pain: Secondary | ICD-10-CM | POA: Diagnosis present

## 2020-10-15 LAB — COMPREHENSIVE METABOLIC PANEL
ALT: 89 U/L — ABNORMAL HIGH (ref 0–44)
AST: 49 U/L — ABNORMAL HIGH (ref 15–41)
Albumin: 4 g/dL (ref 3.5–5.0)
Alkaline Phosphatase: 138 U/L — ABNORMAL HIGH (ref 38–126)
Anion gap: 10 (ref 5–15)
BUN: 15 mg/dL (ref 6–20)
CO2: 24 mmol/L (ref 22–32)
Calcium: 9.2 mg/dL (ref 8.9–10.3)
Chloride: 100 mmol/L (ref 98–111)
Creatinine, Ser: 0.93 mg/dL (ref 0.44–1.00)
GFR, Estimated: >60 mL/min (ref >60)
Glucose, Bld: 112 mg/dL — ABNORMAL HIGH (ref 70–99)
Potassium: 3.9 mmol/L (ref 3.5–5.1)
Sodium: 134 mmol/L — ABNORMAL LOW (ref 135–145)
Total Bilirubin: 0.9 mg/dL (ref 0.3–1.2)
Total Protein: 8.1 g/dL (ref 6.5–8.1)

## 2020-10-15 LAB — CBC
HCT: 40.5 % (ref 36.0–46.0)
Hemoglobin: 13.5 g/dL (ref 12.0–15.0)
MCH: 29.2 pg (ref 26.0–34.0)
MCHC: 33.3 g/dL (ref 30.0–36.0)
MCV: 87.7 fL (ref 80.0–100.0)
Platelets: 335 10*3/uL (ref 150–400)
RBC: 4.62 MIL/uL (ref 3.87–5.11)
RDW: 12 % (ref 11.5–15.5)
WBC: 12.8 10*3/uL — ABNORMAL HIGH (ref 4.0–10.5)
nRBC: 0 % (ref 0.0–0.2)

## 2020-10-15 LAB — URINALYSIS, COMPLETE (UACMP) WITH MICROSCOPIC
Bilirubin Urine: NEGATIVE
Glucose, UA: NEGATIVE mg/dL
Ketones, ur: 5 mg/dL — AB
Nitrite: NEGATIVE
Protein, ur: 30 mg/dL — AB
Specific Gravity, Urine: 1.028 (ref 1.005–1.030)
pH: 5 (ref 5.0–8.0)

## 2020-10-15 LAB — POC URINE PREG, ED: Preg Test, Ur: NEGATIVE

## 2020-10-15 LAB — LIPASE, BLOOD: Lipase: 26 U/L (ref 11–51)

## 2020-10-15 MED ORDER — HYDROCODONE-ACETAMINOPHEN 5-325 MG PO TABS: 1.0000 | ORAL_TABLET | ORAL | 0 refills | Status: AC | PRN

## 2020-10-15 MED ORDER — HYDROCODONE-ACETAMINOPHEN 5-325 MG PO TABS
1.0000 | ORAL_TABLET | Freq: Once | ORAL | Status: AC
  Administered 2020-10-15: 1 via ORAL
  Filled 2020-10-15: qty 1

## 2020-10-15 NOTE — ED Triage Notes (Signed)
Pt to ED for RLQ abdominal pain x1 week. States seen in ED and dx with ulcer, H.Pylori and gallbladder inflammation +nausea  Taking bentyl and pepcid with no relief   Was told to come back to ER for pain control from PCP  Ambulatory to triage

## 2020-10-15 NOTE — ED Provider Notes (Signed)
[facility] Emergency Department Provider Note  ____________________________________________  Time seen: Approximately 6:34 PM  I have reviewed the triage vital signs and the nursing notes.   HISTORY  Chief Complaint Abdominal Pain    HPI Hannah Carroll is a 29 y.o. female who presents emergency department complaining of epigastric abdominal pain.  Patient has a history of gastric ulcer from H. pylori.  She was recently seen  at [facility] for this complaint.  Patient had a reassuring work-up at that time.  Slight elevation of the white blood cell count but CT scan was reassuring.  Patient was diagnosed with peptic ulcer disease from H. pylori and prescribed famotidine.  Patient followed up with her primary care yesterday, was given a prescription for amoxicillin and tetracycline for antibiotic coverage, as well as omeprazole.  Patient had a prescription for Bentyl and Zofran as well for symptom control.  Patient has persistent epigastric pain and called her primary care who referred her to the emergency department for pain control.  Patient has had no fevers or chills.  Some nausea but no emesis.  No diarrhea or constipation.  Pain remains in the epigastric region.  She states that after starting the medications the pain is somewhat improved but at this time she states that it is so bad that she has not been able to eat good meals or for when she stays hydrated again secondary to pain.        History reviewed. No pertinent past medical history.  Patient Active Problem List   Diagnosis Date Noted  . Active labor at term 08/04/2016  . Indication for care in labor or delivery 08/04/2016    History reviewed. No pertinent surgical history.  Prior to Admission medications   Medication Sig Start Date End Date Taking? Authorizing Provider  HYDROcodone-acetaminophen (NORCO/VICODIN) 5-325 MG tablet Take 1 tablet by mouth every 4 (four) hours as needed for moderate pain.  10/15/20   Kaleb Linquist, Delorise Royals, PA-C  ibuprofen (ADVIL,MOTRIN) 600 MG tablet Take 1 tablet (600 mg total) by mouth every 6 (six) hours. 08/05/16   Schermerhorn, Ihor Austin, MD  Prenatal Vit-Fe Fumarate-FA (PRENATAL MULTIVITAMIN) TABS tablet Take 1 tablet by mouth daily at 12 noon.    [provider]    Allergies Patient has no known allergies.  No family history on file.  Social History Social History   Tobacco Use  . Smoking status: Never Smoker  . Smokeless tobacco: Never Used  Substance Use Topics  . Alcohol use: No  . Drug use: No     Review of Systems  Constitutional: No fever/chills Eyes: No visual changes. No discharge ENT: No upper respiratory complaints. Cardiovascular: no chest pain. Respiratory: no cough. No SOB. Gastrointestinal: Epigastric abdominal pain.  No nausea, no vomiting.  No diarrhea.  No constipation. Genitourinary: Negative for dysuria. No hematuria Musculoskeletal: Negative for musculoskeletal pain. Skin: Negative for rash, abrasions, lacerations, ecchymosis. Neurological: Negative for headaches, focal weakness or numbness.  10 System ROS otherwise negative.  ____________________________________________   PHYSICAL EXAM:  VITAL SIGNS: ED Triage Vitals  Enc Vitals Group     BP 10/15/20 1545 117/81     Pulse Rate 10/15/20 1545 100     Resp 10/15/20 1545 20     Temp 10/15/20 1545 97.7 F (36.5 C)     Temp Source 10/15/20 1545 Oral     SpO2 10/15/20 1545 99 %     Weight 10/15/20 1546 166 lb (75.3 kg)  Height 10/15/20 1546 5\' 2"  (1.575 m)     Head Circumference --      Peak Flow --      Pain Score 10/15/20 1546 9     Pain Loc --      Pain Edu? --      Excl. in GC? --      Constitutional: Alert and oriented. Well appearing and in no acute distress. Eyes: Conjunctivae are normal. PERRL. EOMI. Head: Atraumatic. ENT:      Ears:       Nose: No congestion/rhinnorhea.      Mouth/Throat: Mucous membranes are moist.  Neck: No  stridor.    Cardiovascular: Normal rate, regular rhythm. Normal S1 and S2.  Good peripheral circulation. Respiratory: Normal respiratory effort without tachypnea or retractions. Lungs CTAB. Good air entry to the bases with no decreased or absent breath sounds. Gastrointestinal: Visualization of the external abdominal reveals no visible findings.  Bowel sounds 4 quadrants.  Soft to palpation all quadrants.  Patient is tender in the epigastric region with no other tenderness.  No guarding or rigidity. No palpable masses. No distention. No CVA tenderness. Musculoskeletal: Full range of motion to all extremities. No gross deformities appreciated. Neurologic:  Normal speech and language. No gross focal neurologic deficits are appreciated.  Skin:  Skin is warm, dry and intact. No rash noted. Psychiatric: Mood and affect are normal. Speech and behavior are normal. Patient exhibits appropriate insight and judgement.   ____________________________________________   LABS (all labs ordered are listed, but only abnormal results are displayed)  Labs Reviewed  COMPREHENSIVE METABOLIC PANEL - Abnormal; Notable for the following components:      Result Value   Sodium 134 (*)    Glucose, Bld 112 (*)    AST 49 (*)    ALT 89 (*)    Alkaline Phosphatase 138 (*)    All other components within normal limits  CBC - Abnormal; Notable for the following components:   WBC 12.8 (*)    All other components within normal limits  URINALYSIS, COMPLETE (UACMP) WITH MICROSCOPIC - Abnormal; Notable for the following components:   Color, Urine AMBER (*)    APPearance HAZY (*)    Hgb urine dipstick MODERATE (*)    Ketones, ur 5 (*)    Protein, ur 30 (*)    Leukocytes,Ua TRACE (*)    Bacteria, UA RARE (*)    All other components within normal limits  LIPASE, BLOOD  POC URINE PREG, ED   ____________________________________________  EKG  ____________________________________________  RADIOLOGY   No results  found.  ____________________________________________    PROCEDURES  Procedure(s) performed:    Procedures    Medications  HYDROcodone-acetaminophen (NORCO/VICODIN) 5-325 MG per tablet 1 tablet (has no administration in time range)     ____________________________________________   INITIAL IMPRESSION / ASSESSMENT AND PLAN / ED COURSE  Pertinent labs & imaging results that were available during my care of the patient were reviewed by me and considered in my medical decision making (see chart for details).  Review of the [place] CSRS was performed in accordance of the [facility] prior to dispensing any controlled drugs.           Patient's diagnosis is consistent with peptic ulcer disease secondary to H. pylori.  Patient presented to the emergency department for ongoing epigastric pain.  Patient has a diagnosis of peptic ulcer disease from H. pylori.  She was just started on famotidine, omeprazole, amoxicillin, tetracycline, Bentyl and Zofran.  Symptoms  are improving somewhat but she continues to have pain.  States that the pain is keeping her from eating and drinking appropriately.  No fevers or chills.  No emesis or diarrhea.  Patient is tender over the epigastric region.  Patient had a CT scan 3 days ago which was reassuring without any acute findings.  White blood cell count has trended down by 2000 from [facility].  At this time I feel there is no indication for further imaging.  Patient is already been started on antibiotic therapy, H3 blocker and proton pump inhibitor.  Continue these medications.  I will prescribe a short course of pain medicine until other medications have further alleviated because of her peptic ulcer.  Follow-up with primary care.  Return precautions discussed with the patient..  Patient is given ED precautions to return to the ED for any worsening or new symptoms.     ____________________________________________  FINAL CLINICAL IMPRESSION(S) / ED DIAGNOSES  Final  diagnoses:  PUD (peptic ulcer disease)  H pylori ulcer      NEW MEDICATIONS STARTED DURING THIS VISIT:  ED Discharge Orders         Ordered    HYDROcodone-acetaminophen (NORCO/VICODIN) 5-325 MG tablet  Every 4 hours PRN        10/15/20 1909              This chart was dictated using voice recognition software/Dragon. Despite best efforts to proofread, errors can occur which can change the meaning. Any change was purely unintentional.    Racheal Patches, PA-C 10/15/20 1909    Gilles Chiquito, MD 10/15/20 1919

## 2020-11-05 DIAGNOSIS — Z419 Encounter for procedure for purposes other than remedying health state, unspecified: Secondary | ICD-10-CM | POA: Diagnosis not present

## 2020-12-06 DIAGNOSIS — Z419 Encounter for procedure for purposes other than remedying health state, unspecified: Secondary | ICD-10-CM | POA: Diagnosis not present

## 2021-01-03 DIAGNOSIS — Z419 Encounter for procedure for purposes other than remedying health state, unspecified: Secondary | ICD-10-CM | POA: Diagnosis not present

## 2021-02-03 DIAGNOSIS — Z419 Encounter for procedure for purposes other than remedying health state, unspecified: Secondary | ICD-10-CM | POA: Diagnosis not present

## 2021-03-05 DIAGNOSIS — Z419 Encounter for procedure for purposes other than remedying health state, unspecified: Secondary | ICD-10-CM | POA: Diagnosis not present

## 2021-04-05 DIAGNOSIS — Z419 Encounter for procedure for purposes other than remedying health state, unspecified: Secondary | ICD-10-CM | POA: Diagnosis not present

## 2021-05-05 DIAGNOSIS — Z419 Encounter for procedure for purposes other than remedying health state, unspecified: Secondary | ICD-10-CM | POA: Diagnosis not present

## 2021-06-05 DIAGNOSIS — Z419 Encounter for procedure for purposes other than remedying health state, unspecified: Secondary | ICD-10-CM | POA: Diagnosis not present

## 2021-07-06 DIAGNOSIS — Z419 Encounter for procedure for purposes other than remedying health state, unspecified: Secondary | ICD-10-CM | POA: Diagnosis not present

## 2021-08-05 DIAGNOSIS — Z419 Encounter for procedure for purposes other than remedying health state, unspecified: Secondary | ICD-10-CM | POA: Diagnosis not present

## 2021-09-05 DIAGNOSIS — Z419 Encounter for procedure for purposes other than remedying health state, unspecified: Secondary | ICD-10-CM | POA: Diagnosis not present

## 2021-10-05 DIAGNOSIS — Z419 Encounter for procedure for purposes other than remedying health state, unspecified: Secondary | ICD-10-CM | POA: Diagnosis not present

## 2021-11-05 DIAGNOSIS — Z419 Encounter for procedure for purposes other than remedying health state, unspecified: Secondary | ICD-10-CM | POA: Diagnosis not present

## 2021-12-06 DIAGNOSIS — Z419 Encounter for procedure for purposes other than remedying health state, unspecified: Secondary | ICD-10-CM | POA: Diagnosis not present

## 2022-01-03 DIAGNOSIS — Z419 Encounter for procedure for purposes other than remedying health state, unspecified: Secondary | ICD-10-CM | POA: Diagnosis not present

## 2022-02-03 DIAGNOSIS — Z419 Encounter for procedure for purposes other than remedying health state, unspecified: Secondary | ICD-10-CM | POA: Diagnosis not present

## 2022-03-05 DIAGNOSIS — Z419 Encounter for procedure for purposes other than remedying health state, unspecified: Secondary | ICD-10-CM | POA: Diagnosis not present

## 2022-04-05 DIAGNOSIS — Z419 Encounter for procedure for purposes other than remedying health state, unspecified: Secondary | ICD-10-CM | POA: Diagnosis not present

## 2022-05-05 DIAGNOSIS — Z419 Encounter for procedure for purposes other than remedying health state, unspecified: Secondary | ICD-10-CM | POA: Diagnosis not present

## 2022-06-05 DIAGNOSIS — Z419 Encounter for procedure for purposes other than remedying health state, unspecified: Secondary | ICD-10-CM | POA: Diagnosis not present

## 2022-07-06 DIAGNOSIS — Z419 Encounter for procedure for purposes other than remedying health state, unspecified: Secondary | ICD-10-CM | POA: Diagnosis not present

## 2022-08-05 DIAGNOSIS — Z419 Encounter for procedure for purposes other than remedying health state, unspecified: Secondary | ICD-10-CM | POA: Diagnosis not present

## 2022-09-05 DIAGNOSIS — Z419 Encounter for procedure for purposes other than remedying health state, unspecified: Secondary | ICD-10-CM | POA: Diagnosis not present

## 2022-10-05 DIAGNOSIS — Z419 Encounter for procedure for purposes other than remedying health state, unspecified: Secondary | ICD-10-CM | POA: Diagnosis not present

## 2022-11-05 DIAGNOSIS — Z419 Encounter for procedure for purposes other than remedying health state, unspecified: Secondary | ICD-10-CM | POA: Diagnosis not present

## 2022-12-06 DIAGNOSIS — Z419 Encounter for procedure for purposes other than remedying health state, unspecified: Secondary | ICD-10-CM | POA: Diagnosis not present

## 2023-01-04 DIAGNOSIS — Z419 Encounter for procedure for purposes other than remedying health state, unspecified: Secondary | ICD-10-CM | POA: Diagnosis not present

## 2023-02-04 DIAGNOSIS — Z419 Encounter for procedure for purposes other than remedying health state, unspecified: Secondary | ICD-10-CM | POA: Diagnosis not present

## 2023-03-06 DIAGNOSIS — Z419 Encounter for procedure for purposes other than remedying health state, unspecified: Secondary | ICD-10-CM | POA: Diagnosis not present

## 2023-04-06 DIAGNOSIS — Z419 Encounter for procedure for purposes other than remedying health state, unspecified: Secondary | ICD-10-CM | POA: Diagnosis not present

## 2023-05-06 DIAGNOSIS — Z419 Encounter for procedure for purposes other than remedying health state, unspecified: Secondary | ICD-10-CM | POA: Diagnosis not present

## 2023-06-06 DIAGNOSIS — Z419 Encounter for procedure for purposes other than remedying health state, unspecified: Secondary | ICD-10-CM | POA: Diagnosis not present

## 2023-07-07 DIAGNOSIS — Z419 Encounter for procedure for purposes other than remedying health state, unspecified: Secondary | ICD-10-CM | POA: Diagnosis not present

## 2023-08-06 DIAGNOSIS — Z419 Encounter for procedure for purposes other than remedying health state, unspecified: Secondary | ICD-10-CM | POA: Diagnosis not present

## 2023-09-06 DIAGNOSIS — Z419 Encounter for procedure for purposes other than remedying health state, unspecified: Secondary | ICD-10-CM | POA: Diagnosis not present

## 2023-10-06 DIAGNOSIS — Z419 Encounter for procedure for purposes other than remedying health state, unspecified: Secondary | ICD-10-CM | POA: Diagnosis not present

## 2023-11-06 DIAGNOSIS — Z419 Encounter for procedure for purposes other than remedying health state, unspecified: Secondary | ICD-10-CM | POA: Diagnosis not present

## 2023-12-07 DIAGNOSIS — Z419 Encounter for procedure for purposes other than remedying health state, unspecified: Secondary | ICD-10-CM | POA: Diagnosis not present

## 2024-01-04 DIAGNOSIS — Z419 Encounter for procedure for purposes other than remedying health state, unspecified: Secondary | ICD-10-CM | POA: Diagnosis not present

## 2024-02-15 DIAGNOSIS — Z419 Encounter for procedure for purposes other than remedying health state, unspecified: Secondary | ICD-10-CM | POA: Diagnosis not present

## 2024-03-09 ENCOUNTER — Other Ambulatory Visit: Payer: Self-pay | Admitting: Student

## 2024-03-09 ENCOUNTER — Encounter: Payer: Self-pay | Admitting: Student

## 2024-03-09 DIAGNOSIS — H93A2 Pulsatile tinnitus, left ear: Secondary | ICD-10-CM

## 2024-03-16 DIAGNOSIS — Z419 Encounter for procedure for purposes other than remedying health state, unspecified: Secondary | ICD-10-CM | POA: Diagnosis not present

## 2024-04-16 DIAGNOSIS — Z419 Encounter for procedure for purposes other than remedying health state, unspecified: Secondary | ICD-10-CM | POA: Diagnosis not present

## 2024-05-16 DIAGNOSIS — Z419 Encounter for procedure for purposes other than remedying health state, unspecified: Secondary | ICD-10-CM | POA: Diagnosis not present

## 2024-06-16 DIAGNOSIS — Z419 Encounter for procedure for purposes other than remedying health state, unspecified: Secondary | ICD-10-CM | POA: Diagnosis not present

## 2024-07-17 DIAGNOSIS — Z419 Encounter for procedure for purposes other than remedying health state, unspecified: Secondary | ICD-10-CM | POA: Diagnosis not present
